# Patient Record
Sex: Male | Born: 1990 | Race: Black or African American | Hispanic: No | Marital: Single | Smoking: Current some day smoker
Health system: Southern US, Community
[De-identification: ages and names within clinical notes are randomized; demographics above are authoritative.]

## PROBLEM LIST (undated history)

## (undated) DIAGNOSIS — F1210 Cannabis abuse, uncomplicated: Secondary | ICD-10-CM

## (undated) DIAGNOSIS — R55 Syncope and collapse: Secondary | ICD-10-CM

## (undated) DIAGNOSIS — Z720 Tobacco use: Secondary | ICD-10-CM

---
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
## 2013-06-25 ENCOUNTER — Emergency Department: Payer: Self-pay | Admitting: Emergency Medicine

## 2013-06-25 LAB — URINALYSIS, COMPLETE
BILIRUBIN, UR: NEGATIVE
Bacteria: NONE SEEN
Blood: NEGATIVE
GLUCOSE, UR: NEGATIVE mg/dL (ref 0–75)
KETONE: NEGATIVE
Leukocyte Esterase: NEGATIVE
NITRITE: NEGATIVE
PROTEIN: NEGATIVE
Ph: 6 (ref 4.5–8.0)
Specific Gravity: 1.003 (ref 1.003–1.030)
Squamous Epithelial: NONE SEEN
WBC UR: 3 /HPF (ref 0–5)

## 2014-01-18 ENCOUNTER — Emergency Department: Payer: Self-pay | Admitting: Emergency Medicine

## 2014-05-13 ENCOUNTER — Emergency Department: Payer: Self-pay | Admitting: Emergency Medicine

## 2014-05-24 ENCOUNTER — Emergency Department: Payer: Self-pay | Admitting: Emergency Medicine

## 2015-06-24 ENCOUNTER — Other Ambulatory Visit: Payer: Self-pay

## 2015-12-21 ENCOUNTER — Emergency Department
Admission: EM | Admit: 2015-12-21 | Discharge: 2015-12-21 | Disposition: A | Discharge status: Home / Self Care | Payer: 59 | Attending: Emergency Medicine | Admitting: Emergency Medicine

## 2015-12-21 ENCOUNTER — Encounter: Payer: Self-pay | Admitting: Emergency Medicine

## 2015-12-21 DIAGNOSIS — Y929 Unspecified place or not applicable: Secondary | ICD-10-CM | POA: Diagnosis not present

## 2015-12-21 DIAGNOSIS — H7291 Unspecified perforation of tympanic membrane, right ear: Secondary | ICD-10-CM | POA: Insufficient documentation

## 2015-12-21 DIAGNOSIS — X58XXXA Exposure to other specified factors, initial encounter: Secondary | ICD-10-CM | POA: Diagnosis not present

## 2015-12-21 DIAGNOSIS — H9201 Otalgia, right ear: Secondary | ICD-10-CM | POA: Diagnosis present

## 2015-12-21 DIAGNOSIS — Y939 Activity, unspecified: Secondary | ICD-10-CM | POA: Diagnosis not present

## 2015-12-21 DIAGNOSIS — Y999 Unspecified external cause status: Secondary | ICD-10-CM | POA: Insufficient documentation

## 2015-12-21 DIAGNOSIS — T161XXA Foreign body in right ear, initial encounter: Secondary | ICD-10-CM | POA: Diagnosis not present

## 2015-12-21 DIAGNOSIS — F17210 Nicotine dependence, cigarettes, uncomplicated: Secondary | ICD-10-CM | POA: Diagnosis not present

## 2015-12-21 MED ORDER — OFLOXACIN 0.3 % OT SOLN: 5.0000 [drp] | Freq: Two times a day (BID) | OTIC | 0 refills | Status: AC

## 2015-12-21 NOTE — ED Provider Notes (Signed)
[facility] Emergency Department Provider Note  ____________________________________________  Time seen: Approximately 5:48 PM  I have reviewed the triage vital signs and the nursing notes.   HISTORY  Chief Complaint Otalgia    HPI Dennis Sanders is a 25 y.o. male who presents emergency department complaining of right ear pain and decreased hearing. Patient reports that he took a shower last night, went to bed with no issues. This morning he woke up with right ear pain and decreased hearing on that side. Patient denies any fevers or chills, nasal congestion, sore throat or other URI symptom. Patient has attempted to "clear my ear" with Q-tips, both tweezers, running water into his ear.   History reviewed. No pertinent past medical history.  There are no active problems to display for this patient.   History reviewed. No pertinent surgical history.  Prior to Admission medications   Medication Sig Start Date End Date Taking? Authorizing Provider  ofloxacin (FLOXIN) 0.3 % otic solution Place 5 drops into the right ear 2 (two) times daily. 12/21/15 12/26/15  Delorise RoyalsJonathan D Shatira Dobosz, PA-C    Allergies Review of patient's allergies indicates no known allergies.  No family history on file.  Social History Social History  Substance Use Topics  . Smoking status: Current Some Day Smoker    Packs/day: 0.20    Types: Cigarettes  . Smokeless tobacco: Never Used  . Alcohol use No     Review of Systems  Constitutional: No fever/chills Eyes: No visual changes. No discharge ENT: As above for right ear pain and decreased hearing on the right side Cardiovascular: no chest pain. Respiratory: no cough. No SOB. Skin: Negative for rash, abrasions, lacerations, ecchymosis. Neurological: Negative for headaches, focal weakness or numbness. 10-point ROS otherwise negative.  ____________________________________________   PHYSICAL EXAM:  VITAL SIGNS: ED Triage  Vitals  Enc Vitals Group     BP 12/21/15 1712 121/77     Pulse Rate 12/21/15 1712 (!) 59     Resp 12/21/15 1712 18     Temp 12/21/15 1712 97.9 F (36.6 C)     Temp Source 12/21/15 1712 Oral     SpO2 12/21/15 1712 99 %     Weight 12/21/15 1706 185 lb (83.9 kg)     Height 12/21/15 1706 5\' 9"  (1.753 m)     Head Circumference --      Peak Flow --      Pain Score 12/21/15 1706 5     Pain Loc --      Pain Edu? --      Excl. in GC? --      Constitutional: Alert and oriented. Well appearing and in no acute distress. Eyes: Conjunctivae are normal. PERRL. EOMI. Head: Atraumatic. ENT:      Ears: EACs and TMs on the left is unremarkable. EAC on right is obstructed with a blue/white marbled substance. Patient still endorses being able to hear and right side but it is decreased when compared with left.      Nose: No congestion/rhinnorhea.      Mouth/Throat: Mucous membranes are moist.  Neck: No stridor.    Cardiovascular: Normal rate, regular rhythm. Normal S1 and S2.  Good peripheral circulation. Respiratory: Normal respiratory effort without tachypnea or retractions. Lungs CTAB. Good air entry to the bases with no decreased or absent breath sounds. Musculoskeletal: Full range of motion to all extremities. No gross deformities appreciated. Neurologic:  Normal speech and language. No gross focal neurologic deficits are appreciated.  Skin:  Skin is warm, dry and intact. No rash noted. Psychiatric: Mood and affect are normal. Speech and behavior are normal. Patient exhibits appropriate insight and judgement.   ____________________________________________   LABS (all labs ordered are listed, but only abnormal results are displayed)  Labs Reviewed - No data to display ____________________________________________  EKG   ____________________________________________  RADIOLOGY   No results found.  ____________________________________________    PROCEDURES  Procedure(s)  performed:    .Foreign Body Removal Date/Time: 12/21/2015 5:51 PM Performed by: Gala RomneyUTHRIELL, Raina Sole D Authorized by: Gala RomneyUTHRIELL, Isaak Delmundo D  Consent: Verbal consent obtained. Consent given by: patient Body area: ear Localization method: ENT speculum and visualized Removal mechanism: ear scoop and irrigation Complexity: simple 1 objects recovered. Objects recovered: soap particle Post-procedure assessment: foreign body removed Comments: [facility]. [facility] side status post removal reveals complete removal foreign body. TM with apparent puncture wound. EAC has signs of abrasion consistent with patient's attempt at removal with Q-tip and blunt tweezers      Medications - No data to display   ____________________________________________   INITIAL IMPRESSION / ASSESSMENT AND PLAN / ED COURSE  Pertinent labs & imaging results that were available during my care of the patient were reviewed by me and considered in my medical decision making (see chart for details).  Clinical Course    Patient's diagnosis is consistent with foreign body in the right ear. This is successfully removed. Visualization status post removal reveals area consistent with puncture to the tympanic membrane.. Patient will be discharged home with prescriptions for antibiotic eardrops. Patient is to follow up with ENT surgeon for follow-up. Patient is given ED precautions to return to the ED for any worsening or new symptoms.     ____________________________________________  FINAL CLINICAL IMPRESSION(S) / ED DIAGNOSES  Final diagnoses:  Foreign body in right ear, initial encounter  Tympanic membrane perforation, right      NEW MEDICATIONS STARTED DURING THIS VISIT:  New Prescriptions   OFLOXACIN (FLOXIN) 0.3 % OTIC SOLUTION    Place 5 drops into the right ear 2 (two) times daily.        This chart was dictated using voice recognition software/Dragon. Despite best efforts to  proofread, errors can occur which can change the meaning. Any change was purely unintentional.    Racheal PatchesJonathan D Raiana Pharris, PA-C 12/21/15 1821    Loleta Roseory Forbach, MD 12/21/15 2015

## 2015-12-21 NOTE — ED Triage Notes (Signed)
Patient presents to the ED with right ear pain.  Patient states, "it feels like there is something in there, I can't get it out, and I can't hear very well."  Patient states pain began last night.

## 2017-06-20 ENCOUNTER — Emergency Department: Payer: 59

## 2017-06-20 ENCOUNTER — Other Ambulatory Visit: Payer: Self-pay

## 2017-06-20 ENCOUNTER — Emergency Department
Admission: EM | Admit: 2017-06-20 | Discharge: 2017-06-20 | Disposition: A | Discharge status: Home / Self Care | Payer: 59 | Attending: Emergency Medicine | Admitting: Emergency Medicine

## 2017-06-20 ENCOUNTER — Encounter: Payer: Self-pay | Admitting: Emergency Medicine

## 2017-06-20 DIAGNOSIS — S60512A Abrasion of left hand, initial encounter: Secondary | ICD-10-CM | POA: Insufficient documentation

## 2017-06-20 DIAGNOSIS — F17210 Nicotine dependence, cigarettes, uncomplicated: Secondary | ICD-10-CM | POA: Diagnosis not present

## 2017-06-20 DIAGNOSIS — Y9389 Activity, other specified: Secondary | ICD-10-CM | POA: Diagnosis not present

## 2017-06-20 DIAGNOSIS — T148XXA Other injury of unspecified body region, initial encounter: Secondary | ICD-10-CM

## 2017-06-20 DIAGNOSIS — S41112A Laceration without foreign body of left upper arm, initial encounter: Secondary | ICD-10-CM

## 2017-06-20 DIAGNOSIS — W25XXXA Contact with sharp glass, initial encounter: Secondary | ICD-10-CM | POA: Diagnosis not present

## 2017-06-20 DIAGNOSIS — Y999 Unspecified external cause status: Secondary | ICD-10-CM | POA: Diagnosis not present

## 2017-06-20 DIAGNOSIS — Y929 Unspecified place or not applicable: Secondary | ICD-10-CM | POA: Insufficient documentation

## 2017-06-20 DIAGNOSIS — S60511A Abrasion of right hand, initial encounter: Secondary | ICD-10-CM | POA: Insufficient documentation

## 2017-06-20 MED ORDER — TETANUS-DIPHTH-ACELL PERTUSSIS 5-2.5-18.5 LF-MCG/0.5 IM SUSP
0.5000 mL | Freq: Once | INTRAMUSCULAR | Status: AC
  Administered 2017-06-20: 0.5 mL via INTRAMUSCULAR

## 2017-06-20 MED ORDER — BACITRACIN ZINC 500 UNIT/GM EX OINT
TOPICAL_OINTMENT | CUTANEOUS | Status: AC
  Filled 2017-06-20: qty 1.8

## 2017-06-20 MED ORDER — LIDOCAINE HCL (PF) 1 % IJ SOLN
INTRAMUSCULAR | Status: AC
  Filled 2017-06-20: qty 10

## 2017-06-20 MED ORDER — OXYCODONE-ACETAMINOPHEN 5-325 MG PO TABS
1.0000 | ORAL_TABLET | Freq: Once | ORAL | Status: AC
  Administered 2017-06-20: 1 via ORAL
  Filled 2017-06-20: qty 1

## 2017-06-20 MED ORDER — TETANUS-DIPHTH-ACELL PERTUSSIS 5-2.5-18.5 LF-MCG/0.5 IM SUSP
INTRAMUSCULAR | Status: AC
  Administered 2017-06-20: 0.5 mL via INTRAMUSCULAR
  Filled 2017-06-20: qty 0.5
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
## 2017-06-20 NOTE — ED Notes (Signed)
XR at bedside
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
## 2017-06-20 NOTE — Discharge Instructions (Signed)
1.  Your tetanus has been updated and will be good for 10 years. 2.  Suture removal in 7-10 days. 3.  Return to the ER for worsening symptoms, increased redness/swelling, purulent discharge or other concerns.

## 2017-06-20 NOTE — ED Notes (Signed)
Pt wounds cleaned with sterile saline

## 2017-06-20 NOTE — ED Notes (Signed)
Pt. Verbalizes understanding of d/c instructions, medications, and follow-up. pain controlled per pt.  Pt. In NAD at time of d/c and denies further concerns regarding this visit. Pt. Stable at the time of departure from the unit, departing unit by the safest and most appropriate manner per that pt condition and limitations with all belongings accounted for. Pt advised to return to the ED at any time for emergent concerns, or for new/worsening symptoms.   

## 2017-06-20 NOTE — ED Notes (Signed)
Bleeding ceased at this time.

## 2017-09-05 ENCOUNTER — Other Ambulatory Visit: Payer: Self-pay

## 2017-09-05 ENCOUNTER — Observation Stay
Admission: EM | Admit: 2017-09-05 | Discharge: 2017-09-06 | Disposition: A | Discharge status: Home / Self Care | Payer: 59 | Attending: Internal Medicine | Admitting: Internal Medicine

## 2017-09-05 DIAGNOSIS — F17210 Nicotine dependence, cigarettes, uncomplicated: Secondary | ICD-10-CM | POA: Insufficient documentation

## 2017-09-05 DIAGNOSIS — Z79899 Other long term (current) drug therapy: Secondary | ICD-10-CM | POA: Diagnosis not present

## 2017-09-05 DIAGNOSIS — S50311A Abrasion of right elbow, initial encounter: Secondary | ICD-10-CM | POA: Insufficient documentation

## 2017-09-05 DIAGNOSIS — R55 Syncope and collapse: Principal | ICD-10-CM | POA: Insufficient documentation

## 2017-09-05 DIAGNOSIS — R001 Bradycardia, unspecified: Secondary | ICD-10-CM | POA: Insufficient documentation

## 2017-09-05 DIAGNOSIS — F1210 Cannabis abuse, uncomplicated: Secondary | ICD-10-CM | POA: Insufficient documentation

## 2017-09-05 HISTORY — DX: Cannabis abuse, uncomplicated: F12.10

## 2017-09-05 HISTORY — DX: Tobacco use: Z72.0

## 2017-09-05 HISTORY — DX: Syncope and collapse: R55

## 2017-09-05 LAB — BASIC METABOLIC PANEL
Anion gap: 8 (ref 5–15)
BUN: 8 mg/dL (ref 6–20)
CO2: 25 mmol/L (ref 22–32)
CREATININE: 0.85 mg/dL (ref 0.61–1.24)
Calcium: 8.8 mg/dL — ABNORMAL LOW (ref 8.9–10.3)
Chloride: 107 mmol/L (ref 101–111)
GFR calc Af Amer: >60 mL/min (ref >60)
GFR calc non Af Amer: >60 mL/min (ref >60)
GLUCOSE: 103 mg/dL — AB (ref 65–99)
Potassium: 3.8 mmol/L (ref 3.5–5.1)
SODIUM: 140 mmol/L (ref 135–145)

## 2017-09-05 LAB — HEPATIC FUNCTION PANEL
ALK PHOS: 80 U/L (ref 38–126)
ALT: 21 U/L (ref 17–63)
AST: 30 U/L (ref 15–41)
Albumin: 3.9 g/dL (ref 3.5–5.0)
TOTAL PROTEIN: 6.8 g/dL (ref 6.5–8.1)
Total Bilirubin: 0.7 mg/dL (ref 0.3–1.2)

## 2017-09-05 LAB — TROPONIN I

## 2017-09-05 LAB — CBC
HCT: 41.7 % (ref 40.0–52.0)
Hemoglobin: 14.3 g/dL (ref 13.0–18.0)
MCH: 28.5 pg (ref 26.0–34.0)
MCHC: 34.2 g/dL (ref 32.0–36.0)
MCV: 83.3 fL (ref 80.0–100.0)
PLATELETS: 226 10*3/uL (ref 150–440)
RBC: 5 MIL/uL (ref 4.40–5.90)
RDW: 13.6 % (ref 11.5–14.5)
WBC: 6.4 10*3/uL (ref 3.8–10.6)
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
## 2017-09-06 ENCOUNTER — Observation Stay (HOSPITAL_BASED_OUTPATIENT_CLINIC_OR_DEPARTMENT_OTHER)
Admit: 2017-09-06 | Discharge: 2017-09-06 | Disposition: A | Discharge status: Home / Self Care | Payer: 59 | Attending: Internal Medicine | Admitting: Internal Medicine

## 2017-09-06 ENCOUNTER — Emergency Department: Payer: 59

## 2017-09-06 ENCOUNTER — Observation Stay: Payer: 59

## 2017-09-06 ENCOUNTER — Other Ambulatory Visit: Payer: Self-pay | Admitting: Nurse Practitioner

## 2017-09-06 DIAGNOSIS — R55 Syncope and collapse: Secondary | ICD-10-CM

## 2017-09-06 DIAGNOSIS — R001 Bradycardia, unspecified: Secondary | ICD-10-CM

## 2017-09-06 LAB — HEMOGLOBIN A1C
Hgb A1c MFr Bld: 5.3 % (ref 4.8–5.6)
Mean Plasma Glucose: 105.41 mg/dL

## 2017-09-06 LAB — URINE DRUG SCREEN, QUALITATIVE (ARMC ONLY)
AMPHETAMINES, UR SCREEN: NOT DETECTED
Barbiturates, Ur Screen: NOT DETECTED
Benzodiazepine, Ur Scrn: NOT DETECTED
CANNABINOID 50 NG, UR ~~LOC~~: POSITIVE — AB
COCAINE METABOLITE, UR ~~LOC~~: NOT DETECTED
MDMA (ECSTASY) UR SCREEN: NOT DETECTED
Methadone Scn, Ur: NOT DETECTED
Opiate, Ur Screen: POSITIVE — AB
Phencyclidine (PCP) Ur S: NOT DETECTED
Tricyclic, Ur Screen: NOT DETECTED

## 2017-09-06 LAB — ECHOCARDIOGRAM COMPLETE
Height: 69 in
Weight: 3224 oz

## 2017-09-06 LAB — ETHANOL: Alcohol, Ethyl (B): <10 mg/dL (ref <10)

## 2017-09-06 LAB — TSH: TSH: 0.841 u[IU]/mL (ref 0.350–4.500)

## 2017-09-06 MED ORDER — ONDANSETRON HCL 4 MG PO TABS: 4.0000 mg | ORAL_TABLET | Freq: Four times a day (QID) | ORAL | Status: DC | PRN

## 2017-09-06 MED ORDER — DOCUSATE SODIUM 100 MG PO CAPS
100.0000 mg | ORAL_CAPSULE | Freq: Two times a day (BID) | ORAL | Status: DC
  Administered 2017-09-06: 100 mg via ORAL
  Filled 2017-09-06: qty 1

## 2017-09-06 MED ORDER — IBUPROFEN 600 MG PO TABS
600.0000 mg | ORAL_TABLET | Freq: Once | ORAL | Status: AC
  Administered 2017-09-06: 600 mg via ORAL
  Filled 2017-09-06: qty 1

## 2017-09-06 MED ORDER — IOPAMIDOL (ISOVUE-370) INJECTION 76%
100.0000 mL | Freq: Once | INTRAVENOUS | Status: AC | PRN
  Administered 2017-09-06: 100 mL via INTRAVENOUS

## 2017-09-06 MED ORDER — ONDANSETRON HCL 4 MG/2ML IJ SOLN: 4.0000 mg | Freq: Four times a day (QID) | INTRAMUSCULAR | Status: DC | PRN

## 2017-09-06 MED ORDER — ACETAMINOPHEN 325 MG PO TABS: 650.0000 mg | ORAL_TABLET | Freq: Four times a day (QID) | ORAL | Status: DC | PRN

## 2017-09-06 MED ORDER — ACETAMINOPHEN 650 MG RE SUPP: 650.0000 mg | Freq: Four times a day (QID) | RECTAL | Status: DC | PRN

## 2017-09-06 MED ORDER — SODIUM CHLORIDE 0.9 % IV SOLN
INTRAVENOUS | Status: DC
  Administered 2017-09-06: 04:00:00 via INTRAVENOUS

## 2017-09-06 MED ORDER — HYDROCODONE-ACETAMINOPHEN 5-325 MG PO TABS
1.0000 | ORAL_TABLET | Freq: Once | ORAL | Status: AC
  Administered 2017-09-06: 1 via ORAL
  Filled 2017-09-06: qty 1

## 2017-09-06 MED ORDER — IOPAMIDOL (ISOVUE-300) INJECTION 61%
30.0000 mL | Freq: Once | INTRAVENOUS | Status: AC | PRN
  Administered 2017-09-06: 30 mL via ORAL
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
## 2017-09-06 NOTE — Progress Notes (Signed)
Went over discharge instructions with the patient and family including medication and follow-up appointment. Discontinue peripheral IV and telemetry monitor. NT helped patient to be discharge.

## 2017-09-06 NOTE — Discharge Instructions (Signed)
Follow-up with primary care physician in 1 week Follow-up with cardiology in 2 to 3 days and patient has to get outpatient loop monitor Strictly instructed not to drive at least for 6 months from the syncopal episode Outpatient follow-up with neurology in 1 to 2 weeks

## 2017-09-06 NOTE — Progress Notes (Signed)
*  PRELIMINARY RESULTS* Echocardiogram 2D Echocardiogram has been performed.  Dennis Sanders 09/06/2017, 12:41 PM
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
## 2017-09-10 ENCOUNTER — Telehealth: Payer: Self-pay | Admitting: *Deleted

## 2017-09-10 NOTE — Telephone Encounter (Signed)
-----   Message from Coralee Rud sent at 09/09/2017  2:41 PM EDT ----- Regarding: needs event monitor Per Nicolasa Ducking, NP for syncope

## 2017-09-10 NOTE — Telephone Encounter (Signed)
Patient returning call.

## 2017-09-10 NOTE — Telephone Encounter (Signed)
No answer. Left message to call back.   Patient registered on the [employer] website to receive in the mail.

## 2017-09-11 NOTE — Telephone Encounter (Signed)
I left a message for the patient to call. 

## 2017-09-11 NOTE — Telephone Encounter (Signed)
I spoke with the patient- he is aware that his monitor has been ordered from [employer] and will be shipped directly to his home address. I have advised him if he does not receive the monitor by this time next week to please call and let us know. The patient is agreeable and voices understanding.   The patient also states he has been researching his symptoms on the internet and wanted to talk further with the doctor or PA about what happened to him. I advised him I will forward a message to Ward Givens, NP since he saw him in the hospital to see if he would be willing to call and discuss further with the patient.Marland Kitchen He is not scheduled to come in for follow up until 10/21/17. His cell number is his best contact #.

## 2017-09-12 ENCOUNTER — Ambulatory Visit: Payer: 59 | Admitting: Internal Medicine

## 2017-09-13 NOTE — Telephone Encounter (Signed)
Left message to call back  

## 2017-09-20 ENCOUNTER — Telehealth: Payer: Self-pay | Admitting: Nurse Practitioner

## 2017-09-20 ENCOUNTER — Ambulatory Visit (INDEPENDENT_AMBULATORY_CARE_PROVIDER_SITE_OTHER): Payer: 59

## 2017-09-20 DIAGNOSIS — R55 Syncope and collapse: Secondary | ICD-10-CM

## 2017-09-20 DIAGNOSIS — R001 Bradycardia, unspecified: Secondary | ICD-10-CM

## 2017-09-20 NOTE — Telephone Encounter (Signed)
Spoke with Crystal from [employer] and she states that the patient hooked up and they received a "passed out" indication. She reports that he was in sinus rhythm with a rate of 92 and they did try to reach the patient to confirm. Attempted to call as well and had to leave voicemail message for him to call back. Crystal requested that once we get in touch with the patient to please give her a call back with update at [phone].
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
## 2017-10-21 ENCOUNTER — Ambulatory Visit: Payer: 59 | Admitting: Nurse Practitioner

## 2017-10-22 ENCOUNTER — Ambulatory Visit: Payer: 59 | Admitting: Nurse Practitioner

## 2017-10-22 DIAGNOSIS — R0989 Other specified symptoms and signs involving the circulatory and respiratory systems: Secondary | ICD-10-CM

## 2017-10-23 ENCOUNTER — Encounter: Payer: Self-pay | Admitting: Nurse Practitioner

## 2017-10-29 ENCOUNTER — Encounter: Payer: Self-pay | Admitting: *Deleted

## 2017-11-26 ENCOUNTER — Ambulatory Visit (INDEPENDENT_AMBULATORY_CARE_PROVIDER_SITE_OTHER): Payer: 59 | Admitting: Nurse Practitioner

## 2017-11-26 ENCOUNTER — Encounter: Payer: Self-pay | Admitting: Nurse Practitioner

## 2017-11-26 VITALS — BP 108/78 | HR 61 | Ht 69.0 in | Wt 195.8 lb

## 2017-11-26 DIAGNOSIS — R55 Syncope and collapse: Secondary | ICD-10-CM | POA: Diagnosis not present
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
## 2017-11-26 NOTE — Patient Instructions (Signed)
Medication Instructions:  Your physician recommends that you continue on your current medications as directed. Please refer to the Current Medication list given to you today.   Labwork: none  Testing/Procedures: none  Follow-Up: Your physician recommends that you schedule a follow-up appointment in: as needed.    

## 2018-12-25 ENCOUNTER — Ambulatory Visit: Payer: Self-pay | Admitting: Nurse Practitioner

## 2018-12-25 ENCOUNTER — Other Ambulatory Visit: Payer: Self-pay

## 2018-12-25 DIAGNOSIS — Z113 Encounter for screening for infections with a predominantly sexual mode of transmission: Secondary | ICD-10-CM

## 2018-12-25 NOTE — Progress Notes (Signed)
Here today for STD screening. Accepts bloodwork. Hal Morales, RN Gram Stain results reviewed. No treatment indicated per standing orders. Hal Morales, RN

## 2018-12-25 NOTE — Progress Notes (Signed)
STI clinic/screening visit  Subjective:  Dennis Sanders is a 28 y.o. male being seen today for an STI screening visit. The patient reports they do not have symptoms.  Patient has the following medical conditions:   Patient Active Problem List   Diagnosis Date Noted  . Syncope 09/06/2017     Chief Complaint  Patient presents with  . SEXUALLY TRANSMITTED DISEASE    Just getting tested Denies symptoms at this time   Patient reports - desire for STD testing  Denies any symptoms at this time  See flowsheet for further details and programmatic requirements.    The following portions of the patient's history were reviewed and updated as appropriate: allergies, current medications, past medical history, past social history, past surgical history and problem list.  Objective:  There were no vitals filed for this visit.  Physical Exam Constitutional:      Appearance: Normal appearance.  HENT:     Head: Normocephalic and atraumatic.     Comments: No nits or hair loss    Mouth/Throat:     Mouth: Mucous membranes are moist.     Pharynx: Oropharynx is clear. No oropharyngeal exudate or posterior oropharyngeal erythema.   Pulmonary:     Effort: Pulmonary effort is normal.  Abdominal:     General: Abdomen is flat.     Palpations: Abdomen is soft. There is no hepatomegaly or mass.     Tenderness: There is no abdominal tenderness.  Genitourinary:    Pubic Area: No rash or pubic lice.      Penis: Circumcised. Discharge (small amt clear discharge noted) present.      Scrotum/Testes: Normal.     Epididymis:     Right: Normal.     Left: Normal.  Lymphadenopathy:     Head:     Right side of head: No preauricular or posterior auricular adenopathy.     Left side of head: No preauricular or posterior auricular adenopathy.     Cervical: No cervical adenopathy.     Upper Body:     Right upper body: No supraclavicular or axillary adenopathy.     Left upper body: No supraclavicular or  axillary adenopathy.     Lower Body: No right inguinal adenopathy. No left inguinal adenopathy.  Skin:    General: Skin is warm and dry.     Findings: No rash.  Neurological:     Mental Status: He is alert and oriented to person, place, and time.       Assessment and Plan:  Dennis Sanders is a 28 y.o. male presenting to the [facility] for STI screening  1. Screening examination for STD (sexually transmitted disease) Await results - Gram stain - please treat per standing order - Gonococcus culture - HIV [facility] - Syphilis Serology, [facility]  Client verbalizes understanding and is in agreement with plan of care    No follow-ups on file.  No future appointments.  Berniece Andreas, NP

## 2018-12-26 ENCOUNTER — Encounter: Payer: Self-pay | Admitting: Nurse Practitioner

## 2018-12-26 DIAGNOSIS — Z720 Tobacco use: Secondary | ICD-10-CM | POA: Insufficient documentation

## 2018-12-26 LAB — GRAM STAIN

## 2018-12-29 LAB — GONOCOCCUS CULTURE

## 2019-01-09 ENCOUNTER — Telehealth: Payer: Self-pay

## 2019-01-09 NOTE — Telephone Encounter (Signed)
TC from patient.  Discussed RPR false positive result. Patient would like to come in for recheck on 01/13/19 (works 3rd shift and needs to come in first thing in the morning). Appt scheduled.Aileen Fass, RN

## 2019-01-13 ENCOUNTER — Other Ambulatory Visit: Payer: Self-pay
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
## 2019-06-22 IMAGING — CT CT HEAD W/O CM
3 series · 15 of 47 positions shown, 18 images · non-contrast
Comparison: None.

CLINICAL DATA: Ataxia

EXAM:
CT HEAD WITHOUT CONTRAST
TECHNIQUE: Contiguous axial images were obtained from the base of the skull
through the vertex without intravenous contrast.

[Series 2: head wo · axial · 0.45mm/px · z∈[-71,+59]mm · 9 of 32 slices shown, 12 images]
[im 3/32  brain]
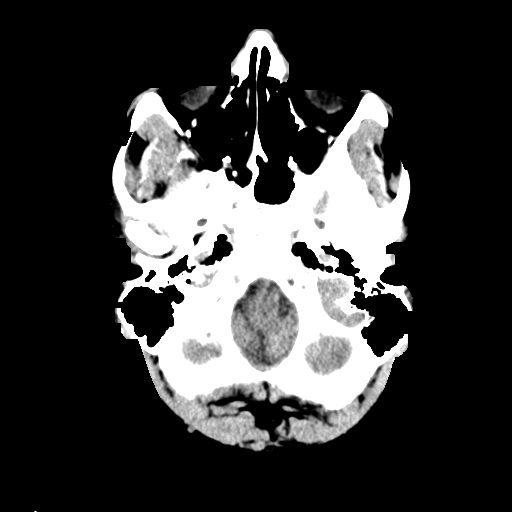
[im 3/32  bone]
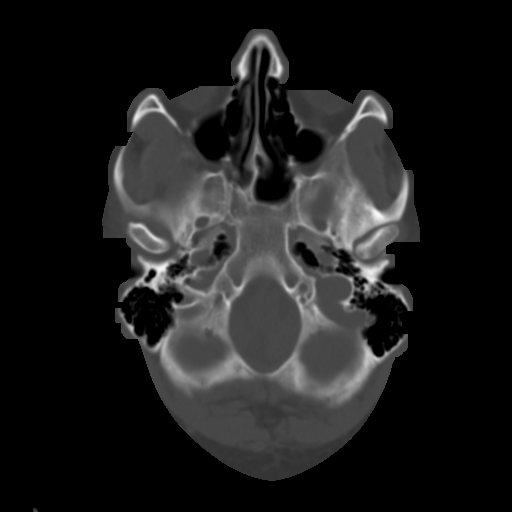
[im 6/32  brain]
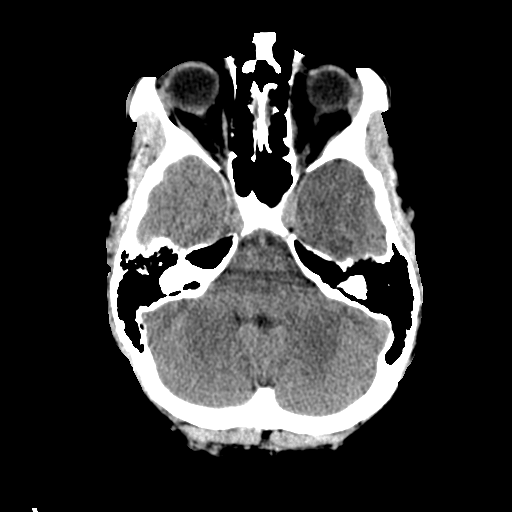
[im 9/32  brain]
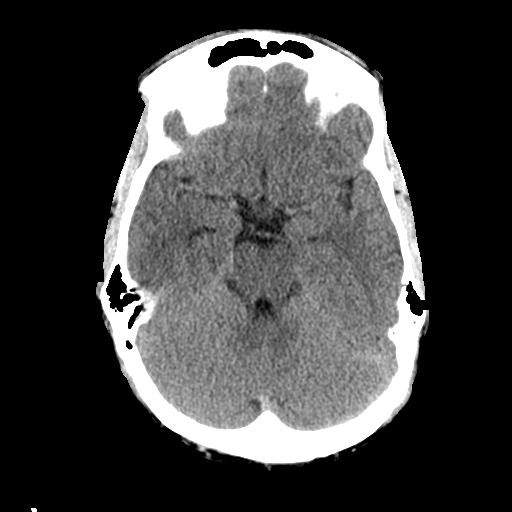
[im 12/32  brain]
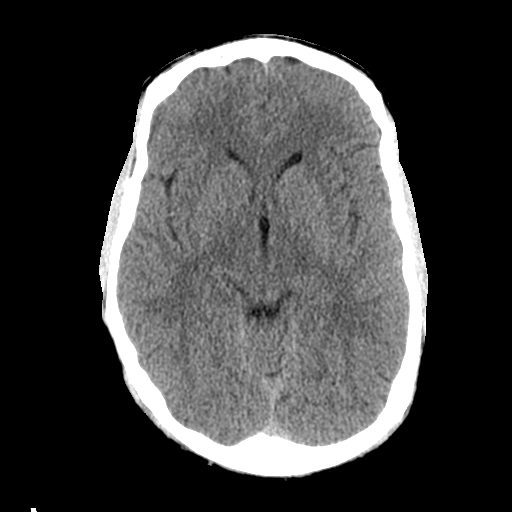
[im 17/32  brain]
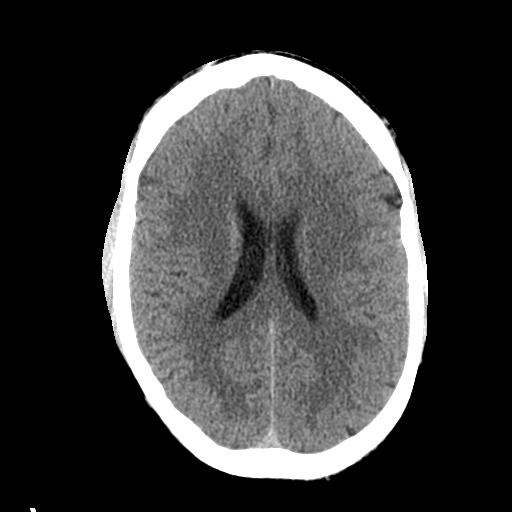
[im 17/32  bone]
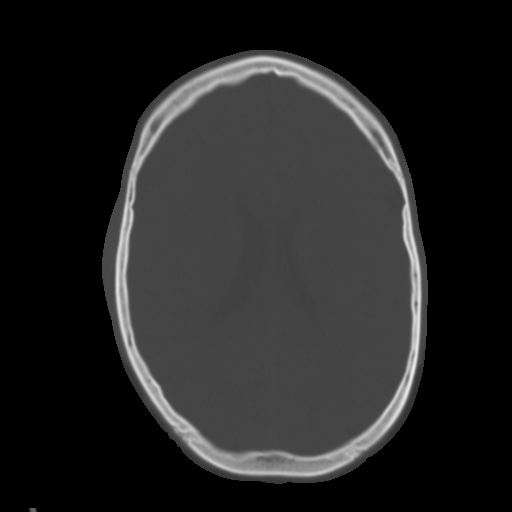
[im 20/32  brain]
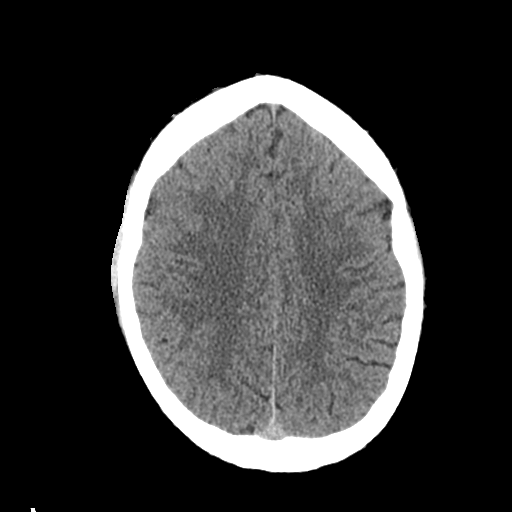
[im 23/32  brain]
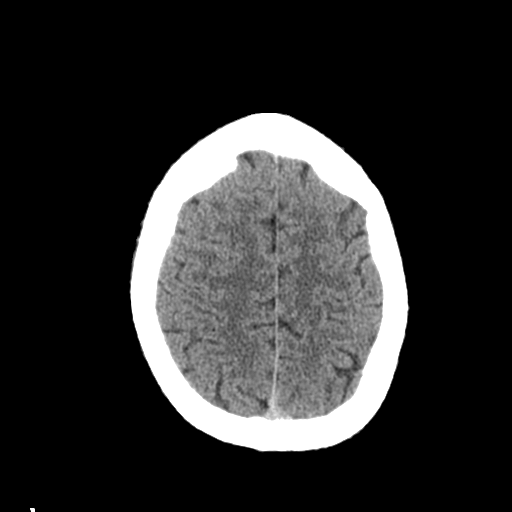
[im 26/32  brain]
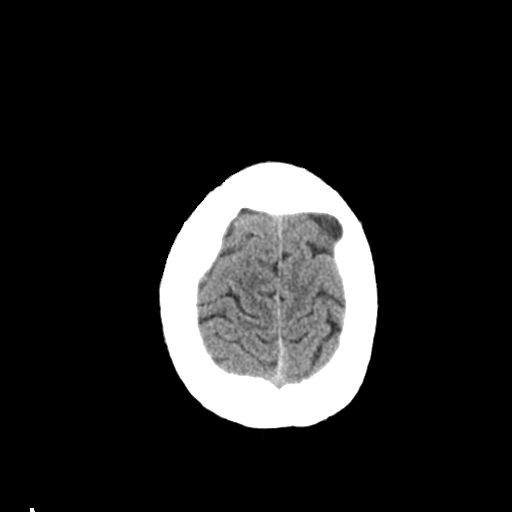
[im 29/32  brain]
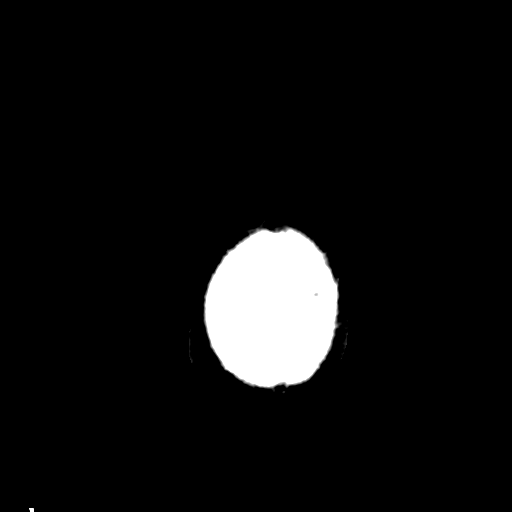
[im 29/32  bone]
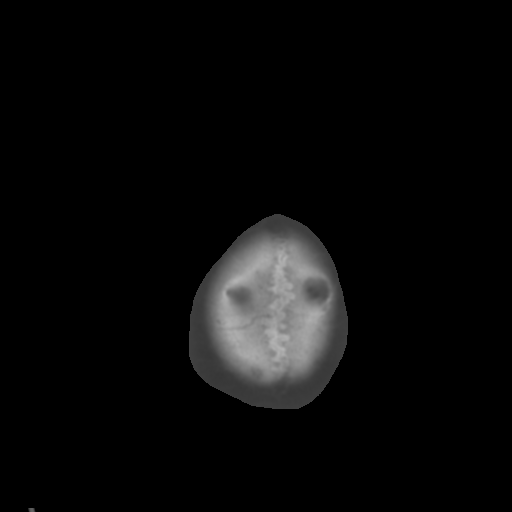

[Series 4: coronal soft tissue · coronal · 0.31mm/px · 3 of 69 slices shown]
[im 23/69  brain]
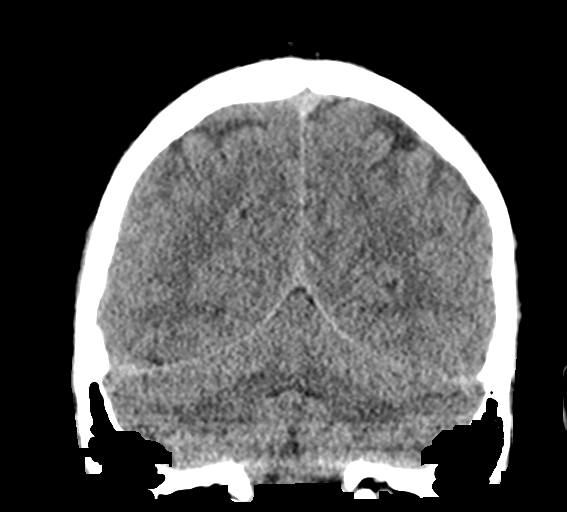
[im 31/69  brain]
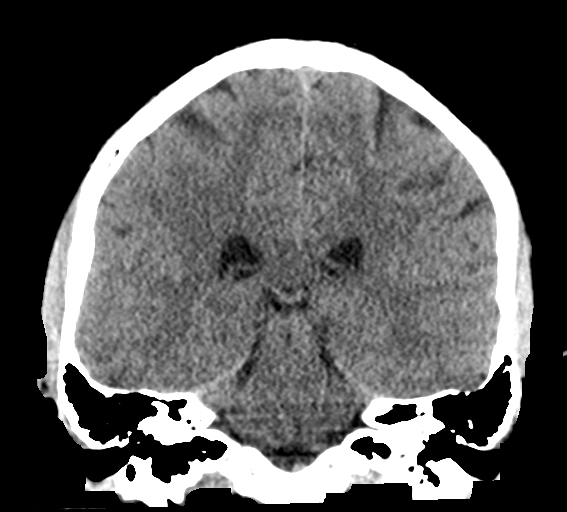
[im 38/69  brain]
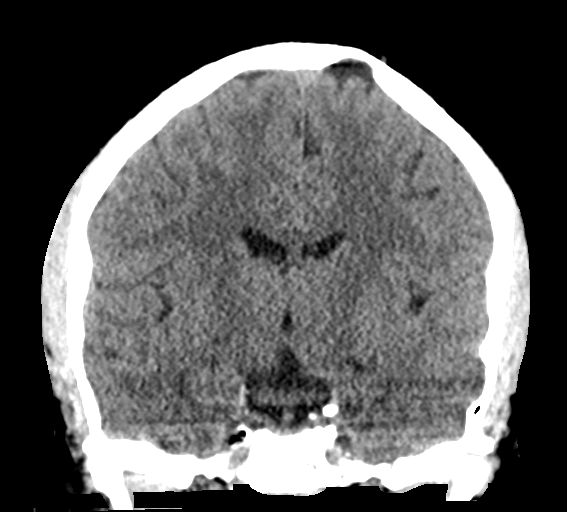

[Series 5: sagittal soft tissue · sagittal · 0.31mm/px · 3 of 55 slices shown]
[im 19/55  brain]
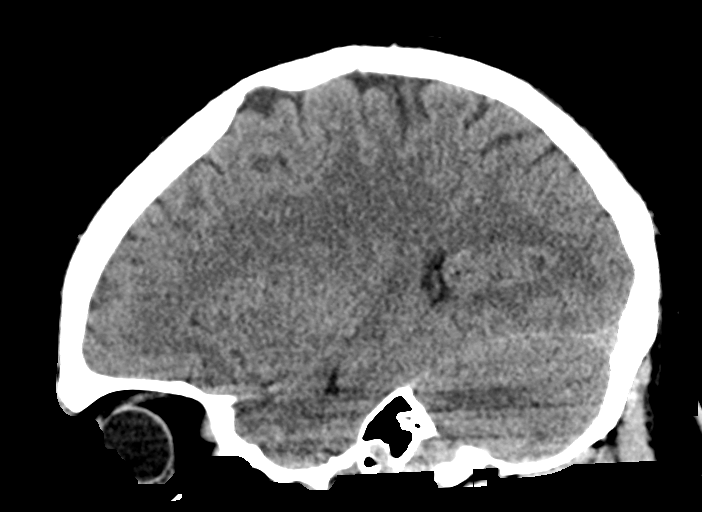
[im 28/55  brain]
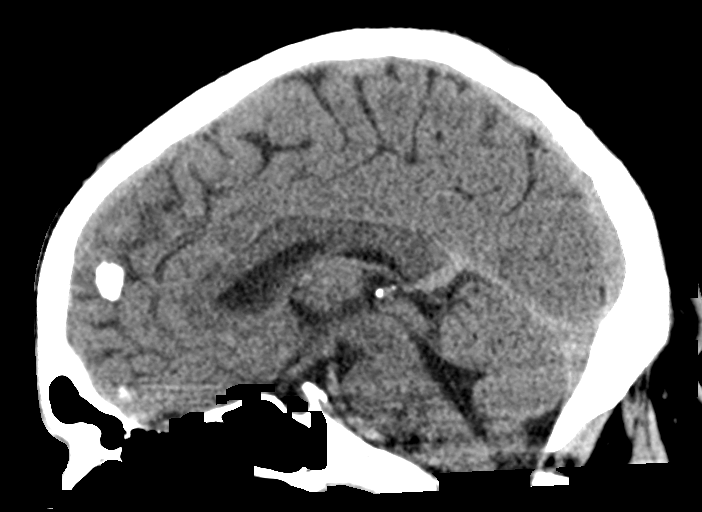
[im 37/55  brain]
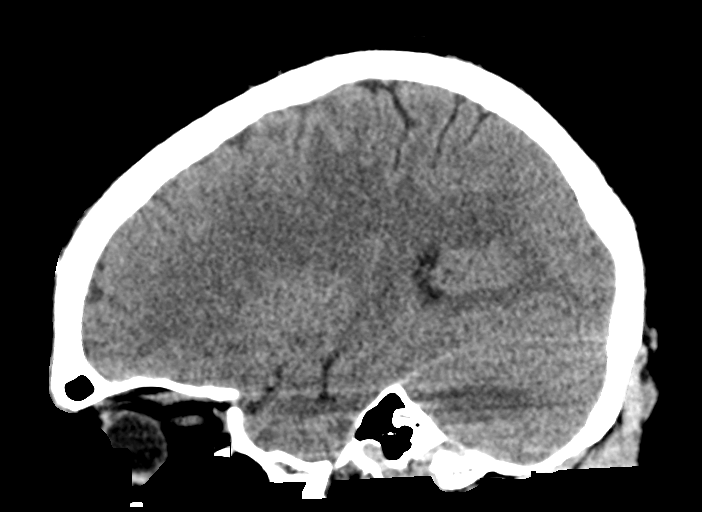

[15 of 47 positions shown; findings below may reference images not displayed]

FINDINGS: Brain: No evidence of acute infarction, hemorrhage, hydrocephalus,
extra-axial collection or mass lesion/mass effect.

Vascular: No hyperdense vessel or unexpected calcification.

Skull: Normal. Negative for fracture or focal lesion.

Sinuses/Orbits: No acute finding.

Other: None
IMPRESSION: Negative non contrasted CT appearance of the brain

## 2019-10-24 ENCOUNTER — Encounter (HOSPITAL_COMMUNITY): Payer: Self-pay

## 2019-10-24 ENCOUNTER — Other Ambulatory Visit: Payer: Self-pay

## 2019-10-24 ENCOUNTER — Ambulatory Visit (HOSPITAL_COMMUNITY)
Admission: EM | Admit: 2019-10-24 | Discharge: 2019-10-24 | Disposition: A | Discharge status: Home / Self Care | Payer: 59 | Attending: Family Medicine | Admitting: Family Medicine

## 2019-10-24 DIAGNOSIS — M940 Chondrocostal junction syndrome [Tietze]: Secondary | ICD-10-CM

## 2019-10-24 MED ORDER — DICLOFENAC SODIUM 75 MG PO TBEC: 75.0000 mg | DELAYED_RELEASE_TABLET | Freq: Two times a day (BID) | ORAL | 0 refills | Status: DC

## 2019-10-24 NOTE — ED Triage Notes (Signed)
Pt describes pain to area below collar bone and to central chest that occurs when he turns; lifts arms, pushes/pulls for past several days. 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 Denies pain at rest or pain to jaw, arm/back.  Pt states this morning after he moved/turned and raised himself out of bed pain occurred and "it made me sweat and breathe hard"; pain subsided when pt rested and then controlled his muscular movements.   Denies SOB, nausea, dizziness.   Took tylenol 325mg  at 1130 this morning that improved symptoms.  Pt requests work note.

## 2019-10-24 NOTE — ED Provider Notes (Signed)
[facility]   [id number] 10/24/19 Arrival Time: 1457  ASSESSMENT & PLAN:  1. Costochondritis     Work note provided. Recommend no heavy lifting for one week. Begin: Meds ordered this encounter  Medications  . diclofenac (VOLTAREN) 75 MG EC tablet    Sig: Take 1 tablet (75 mg total) by mouth 2 (two) times daily.    Dispense:  14 tablet    Refill:  0    Chest pain precautions given. Reviewed expectations re: course of current medical issues. Questions answered. Outlined signs and symptoms indicating need for more acute intervention. Patient verbalized understanding. After Visit Summary given.   SUBJECTIVE:  History from: patient. Dennis Sanders is a 29 y.o. male who presents with complaint of chest wall soreness. Has been working new job where he is lifting heavy items all day; questions relation. No trauma. No associated SOB. Pain much worse with certain movements. Tylenol with some relief. No n/v.   Social History   Tobacco Use  Smoking Status Current Some Day Smoker  . Packs/day: 0.20  . Types: Cigarettes  Smokeless Tobacco Never Used   Social History   Substance and Sexual Activity  Alcohol Use Yes   Comment: occasional - special occasions only      OBJECTIVE:  Vitals:   10/24/19 1532 10/24/19 1533  BP:  (!) 118/59  Pulse: (!) 55   Resp: 18   Temp: 98.5 F (36.9 C)   TempSrc: Oral   SpO2: 99%     General appearance: alert, oriented, no acute distress Eyes: PERRLA; EOMI; conjunctivae normal HENT: normocephalic; atraumatic Neck: supple with FROM Lungs: without labored respirations; speaks full sentences without difficulty Heart: regular Chest Wall: with significant tenderness to palpation around sternum Abdomen: soft, non-tender; no guarding or rebound tenderness Extremities: without edema; without calf swelling or tenderness; symmetrical without gross deformities Skin: warm and dry; without rash or lesions Neuro: normal  gait Psychological: alert and cooperative; normal mood and affect   No Known Allergies  Past Medical History:  Diagnosis Date  . Marijuana abuse    a. smokes a few x/wk.  . Syncope    a. 09/2017 Echo: EF 55-60%, no rwma; b. 10/2017 Event monitor: NSR, no significant arrhythmias. Rare period of bradycardia w/ rates into low 40's.  . Tobacco abuse    Social History   Socioeconomic History  . Marital status: Single    Spouse name: Not on file  . Number of children: Not on file  . Years of education: Not on file  . Highest education level: Not on file  Occupational History  . Not on file  Tobacco Use  . Smoking status: Current Some Day Smoker    Packs/day: 0.20    Types: Cigarettes  . Smokeless tobacco: Never Used  Vaping Use  . Vaping Use: Never used  Substance and Sexual Activity  . Alcohol use: Yes    Comment: occasional - special occasions only  . Drug use: Yes    Types: Marijuana    Comment: few x/ wk.  . Sexual activity: Not on file  Other Topics Concern  . Not on file  Social History Narrative   Lives in [place] with his grandparents.  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).   Social Determinants of Health   Financial Resource Strain:   . Difficulty of Paying Living Expenses:   Food Insecurity:   . Worried About Programme researcher, broadcasting/film/video in the Last Year:   .  Ran Out of Food in the Last Year:   Transportation Needs:   . Film/video editor (Medical):   Marland Kitchen Lack of Transportation (Non-Medical):   Physical Activity:   . Days of Exercise per Week:   . Minutes of Exercise per Session:   Stress:   . Feeling of Stress :   Social Connections:   . Frequency of Communication with Friends and Family:   . Frequency of Social Gatherings with Friends and Family:   . Attends Religious Services:   . Active Member of Clubs or Organizations:   . Attends Archivist Meetings:   Marland Kitchen Marital Status:   Intimate Partner Violence:   . Fear of Current  or Ex-Partner:   . Emotionally Abused:   Marland Kitchen Physically Abused:   . Sexually Abused:    Family History  Problem Relation Age of Onset  . Other Mother        alive and well @ 44  . Other Father        alive and well @ 40  . Arrhythmia Neg Hx    History reviewed. No pertinent surgical history.   Vanessa Kick, MD 10/24/19 1627

## 2019-10-24 NOTE — Discharge Instructions (Signed)
I recommend no heavy lifting for one week.

## 2020-01-12 ENCOUNTER — Encounter: Payer: Self-pay | Admitting: Emergency Medicine

## 2020-01-12 ENCOUNTER — Emergency Department
Admission: EM | Admit: 2020-01-12 | Discharge: 2020-01-12 | Disposition: A | Discharge status: Home / Self Care | Payer: Self-pay | Attending: Emergency Medicine | Admitting: Emergency Medicine

## 2020-01-12 ENCOUNTER — Other Ambulatory Visit: Payer: Self-pay

## 2020-01-12 ENCOUNTER — Emergency Department: Payer: Self-pay

## 2020-01-12 DIAGNOSIS — F17210 Nicotine dependence, cigarettes, uncomplicated: Secondary | ICD-10-CM | POA: Insufficient documentation

## 2020-01-12 DIAGNOSIS — Y939 Activity, unspecified: Secondary | ICD-10-CM | POA: Insufficient documentation

## 2020-01-12 DIAGNOSIS — Z79899 Other long term (current) drug therapy: Secondary | ICD-10-CM | POA: Insufficient documentation

## 2020-01-12 DIAGNOSIS — S61412A Laceration without foreign body of left hand, initial encounter: Secondary | ICD-10-CM | POA: Insufficient documentation

## 2020-01-12 DIAGNOSIS — Y929 Unspecified place or not applicable: Secondary | ICD-10-CM | POA: Insufficient documentation

## 2020-01-12 DIAGNOSIS — S060X0A Concussion without loss of consciousness, initial encounter: Secondary | ICD-10-CM | POA: Insufficient documentation

## 2020-01-12 DIAGNOSIS — W274XXA Contact with kitchen utensil, initial encounter: Secondary | ICD-10-CM | POA: Insufficient documentation

## 2020-01-12 DIAGNOSIS — Y999 Unspecified external cause status: Secondary | ICD-10-CM | POA: Insufficient documentation

## 2020-01-12 MED ORDER — IBUPROFEN 600 MG PO TABS: 600.0000 mg | ORAL_TABLET | Freq: Four times a day (QID) | ORAL | 0 refills | Status: DC | PRN

## 2020-01-12 MED ORDER — LIDOCAINE HCL (PF) 1 % IJ SOLN
5.0000 mL | Freq: Once | INTRAMUSCULAR | Status: AC
  Administered 2020-01-12: 5 mL via INTRADERMAL
  Filled 2020-01-12: qty 5
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
## 2020-03-25 ENCOUNTER — Ambulatory Visit: Payer: Self-pay | Admitting: Family Medicine

## 2020-03-25 ENCOUNTER — Other Ambulatory Visit: Payer: Self-pay

## 2020-03-25 ENCOUNTER — Encounter: Payer: Self-pay | Admitting: Family Medicine

## 2020-03-25 VITALS — Wt 201.6 lb

## 2020-03-25 DIAGNOSIS — Z113 Encounter for screening for infections with a predominantly sexual mode of transmission: Secondary | ICD-10-CM

## 2020-03-25 DIAGNOSIS — A549 Gonococcal infection, unspecified: Secondary | ICD-10-CM

## 2020-03-25 MED ORDER — DOXYCYCLINE HYCLATE 100 MG PO TABS: 100.0000 mg | ORAL_TABLET | Freq: Two times a day (BID) | ORAL | 0 refills | Status: DC

## 2020-03-25 MED ORDER — CEFTRIAXONE SODIUM 500 MG IJ SOLR
500.0000 mg | Freq: Once | INTRAMUSCULAR | Status: AC
  Administered 2020-03-25: 500 mg via INTRAMUSCULAR

## 2020-03-25 MED ORDER — DOXYCYCLINE HYCLATE 100 MG PO TABS: 100.0000 mg | ORAL_TABLET | Freq: Two times a day (BID) | ORAL | 0 refills | Status: AC

## 2020-03-25 NOTE — Progress Notes (Signed)
   [facility] Department STI clinic/screening visit  Subjective:  Dennis Sanders is a 29 y.o. male being seen today for an STI screening visit. The patient reports they do have symptoms.    Patient has the following medical conditions:   Patient Active Problem List   Diagnosis Date Noted  . Tobacco use 12/26/2018  . Syncope 09/06/2017     Chief Complaint  Patient presents with  . SEXUALLY TRANSMITTED DISEASE    HPI  Patient reports having discharge    See flowsheet for further details and programmatic requirements.    The following portions of the patient's history were reviewed and updated as appropriate: allergies, current medications, past medical history, past social history, past surgical history and problem list.  Objective:  There were no vitals filed for this visit.  Physical Exam Constitutional:      Appearance: Normal appearance.  HENT:     Head: Normocephalic.     Comments: In scalp, brows and lashes: no nits, no hair loss     Mouth/Throat:     Mouth: Mucous membranes are moist.     Pharynx: Oropharynx is clear. No oropharyngeal exudate.     Comments: Dental carries and teeth missing  Genitourinary:    Comments: .No lice, nits, or pest, no lesions or odor.  Discharge with yellowish green discharge.  Denies pain or tenderness with paplation of testicles.  No lesions, ulcers or masses present.   Musculoskeletal:     Cervical back: Normal range of motion.  Lymphadenopathy:     Cervical: No cervical adenopathy.  Skin:    General: Skin is warm and dry.     Findings: No bruising, erythema, lesion or rash.  Neurological:     Mental Status: He is alert and oriented to person, place, and time.  Psychiatric:        Mood and Affect: Mood normal.        Behavior: Behavior normal.       Assessment and Plan:  WILBORN MEMBRENO is a 29 y.o. male presenting to the [facility] Department for STI screening  1. Screening examination for  venereal disease    - HIV/HCV [place] [facility] - HBV Antigen/Antibody [facility] - Gram stain - Gonococcus culture - Syphilis Serology, [place]  Patient does have STI symptoms Patient accepted all screenings including  Gram stain, urethral GC  and bloodwork for  HCV, HBV & HIV/RPR.  Patient meets criteria for HepB screening? Yes. Ordered? Yes Patient meets criteria for HepC screening? Yes. Ordered? Yes Recommended condom use with all sex Discussed importance of condom use for STI prevent  2. Gonorrhea   Per gram stain, + gonorrhea, treat for coinfection of chlamydia with  Doxycycline 100 mg BID x 7 days and Ceftraxione 500 mg IM x 1.     Discussed time line for State Lab results and that patient will be called with positive results and encouraged patient to call if he had not heard in 2 weeks   Return if symptoms worsen or fail to improve, for as needed.  No future appointments.  Wendi Snipes, FNP

## 2020-03-25 NOTE — Progress Notes (Addendum)
Per client, passed out when last had penis culture. Jossie Ng, RN Client tolerated Ceftriaxone injection without complaint and counseled to remain in main waiting room for 15 minutes following injection. Client verbalized understanding of request. Jossie Ng, RN

## 2020-03-25 NOTE — Addendum Note (Signed)
Addended by: Jossie Ng on: 03/25/2020 04:43 PM   Modules accepted: Orders

## 2020-03-26 LAB — GRAM STAIN

## 2020-04-01 LAB — GONOCOCCUS CULTURE

## 2020-04-04 ENCOUNTER — Telehealth: Payer: Self-pay

## 2020-04-04 DIAGNOSIS — A549 Gonococcal infection, unspecified: Secondary | ICD-10-CM

## 2020-04-04 NOTE — Telephone Encounter (Signed)
Attempted TC to patient re: +GCresults.  Call went to VM but message didn't indicate if it was correct #. No message left  Patient tx'd at visit.  CD report completed Richmond Campbell, RN

## 2020-04-06 LAB — HEPATITIS B SURFACE ANTIGEN: Hepatitis B Surface Ag: NEGATIVE

## 2020-04-06 LAB — HM HIV SCREENING LAB: HM HIV Screening: NEGATIVE

## 2020-04-06 LAB — HM HEPATITIS C SCREENING LAB: HM Hepatitis Screen: NEGATIVE

## 2020-04-26 NOTE — Telephone Encounter (Signed)
TC to patient. Verified ID via password/SS#. Informed of positive GC. Co TOC in 3 months. Richmond Campbell, RN

## 2021-04-15 ENCOUNTER — Emergency Department: Payer: Self-pay

## 2021-04-15 ENCOUNTER — Emergency Department
Admission: EM | Admit: 2021-04-15 | Discharge: 2021-04-15 | Disposition: A | Discharge status: Home / Self Care | Payer: Self-pay | Attending: Emergency Medicine | Admitting: Emergency Medicine

## 2021-04-15 ENCOUNTER — Other Ambulatory Visit: Payer: Self-pay

## 2021-04-15 DIAGNOSIS — M25511 Pain in right shoulder: Secondary | ICD-10-CM | POA: Insufficient documentation

## 2021-04-15 DIAGNOSIS — R079 Chest pain, unspecified: Secondary | ICD-10-CM | POA: Insufficient documentation

## 2021-04-15 DIAGNOSIS — S21219A Laceration without foreign body of unspecified back wall of thorax without penetration into thoracic cavity, initial encounter: Secondary | ICD-10-CM

## 2021-04-15 DIAGNOSIS — F17210 Nicotine dependence, cigarettes, uncomplicated: Secondary | ICD-10-CM | POA: Insufficient documentation

## 2021-04-15 DIAGNOSIS — R519 Headache, unspecified: Secondary | ICD-10-CM | POA: Insufficient documentation

## 2021-04-15 DIAGNOSIS — M542 Cervicalgia: Secondary | ICD-10-CM | POA: Insufficient documentation

## 2021-04-15 DIAGNOSIS — S21259A Open bite of unspecified back wall of thorax without penetration into thoracic cavity, initial encounter: Secondary | ICD-10-CM | POA: Insufficient documentation

## 2021-04-15 DIAGNOSIS — W503XXA Accidental bite by another person, initial encounter: Secondary | ICD-10-CM

## 2021-04-15 DIAGNOSIS — G8911 Acute pain due to trauma: Secondary | ICD-10-CM | POA: Insufficient documentation

## 2021-04-15 LAB — BASIC METABOLIC PANEL
Anion gap: 9 (ref 5–15)
BUN: 9 mg/dL (ref 6–20)
CO2: 27 mmol/L (ref 22–32)
Calcium: 9.2 mg/dL (ref 8.9–10.3)
Chloride: 104 mmol/L (ref 98–111)
Creatinine, Ser: 1.17 mg/dL (ref 0.61–1.24)
GFR, Estimated: >60 mL/min (ref >60)
Glucose, Bld: 92 mg/dL (ref 70–99)
Potassium: 4.4 mmol/L (ref 3.5–5.1)
Sodium: 140 mmol/L (ref 135–145)

## 2021-04-15 LAB — CBC WITH DIFFERENTIAL/PLATELET
Abs Immature Granulocytes: 0.04 10*3/uL (ref 0.00–0.07)
Basophils Absolute: 0 10*3/uL (ref 0.0–0.1)
Basophils Relative: 0 %
Eosinophils Absolute: 0 10*3/uL (ref 0.0–0.5)
Eosinophils Relative: 0 %
HCT: 43.4 % (ref 39.0–52.0)
Hemoglobin: 14.7 g/dL (ref 13.0–17.0)
Immature Granulocytes: 0 %
Lymphocytes Relative: 16 %
Lymphs Abs: 2 10*3/uL (ref 0.7–4.0)
MCH: 29.5 pg (ref 26.0–34.0)
MCHC: 33.9 g/dL (ref 30.0–36.0)
MCV: 87.1 fL (ref 80.0–100.0)
Monocytes Absolute: 1 10*3/uL (ref 0.1–1.0)
Monocytes Relative: 8 %
Neutro Abs: 9 10*3/uL — ABNORMAL HIGH (ref 1.7–7.7)
Neutrophils Relative %: 76 %
Platelets: 235 10*3/uL (ref 150–400)
RBC: 4.98 MIL/uL (ref 4.22–5.81)
RDW: 13.3 % (ref 11.5–15.5)
WBC: 12 10*3/uL — ABNORMAL HIGH (ref 4.0–10.5)
nRBC: 0 % (ref 0.0–0.2)

## 2021-04-15 MED ORDER — IOHEXOL 300 MG/ML  SOLN
100.0000 mL | Freq: Once | INTRAMUSCULAR | Status: AC | PRN
  Administered 2021-04-15: 100 mL via INTRAVENOUS
  Filled 2021-04-15: qty 100

## 2021-04-15 MED ORDER — MORPHINE SULFATE (PF) 4 MG/ML IV SOLN
4.0000 mg | Freq: Once | INTRAVENOUS | Status: AC
  Administered 2021-04-15: 4 mg via INTRAVENOUS
  Filled 2021-04-15: qty 1

## 2021-04-15 MED ORDER — AMOXICILLIN-POT CLAVULANATE 875-125 MG PO TABS: 1.0000 | ORAL_TABLET | Freq: Two times a day (BID) | ORAL | 0 refills | Status: AC

## 2021-04-15 MED ORDER — AMOXICILLIN-POT CLAVULANATE 875-125 MG PO TABS
1.0000 | ORAL_TABLET | Freq: Once | ORAL | Status: AC
  Administered 2021-04-15: 1 via ORAL
  Filled 2021-04-15: qty 1

## 2021-04-15 MED ORDER — OXYCODONE-ACETAMINOPHEN 5-325 MG PO TABS: 1.0000 | ORAL_TABLET | ORAL | 0 refills | Status: DC | PRN

## 2021-04-15 MED ORDER — OXYCODONE-ACETAMINOPHEN 5-325 MG PO TABS
1.0000 | ORAL_TABLET | Freq: Once | ORAL | Status: AC
  Administered 2021-04-15: 1 via ORAL
  Filled 2021-04-15: qty 1
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
## 2021-04-27 ENCOUNTER — Other Ambulatory Visit: Payer: Self-pay

## 2021-04-27 ENCOUNTER — Encounter: Payer: Self-pay | Admitting: Emergency Medicine

## 2021-04-27 ENCOUNTER — Ambulatory Visit
Admission: EM | Admit: 2021-04-27 | Discharge: 2021-04-27 | Disposition: A | Discharge status: Home / Self Care | Payer: Self-pay | Attending: Emergency Medicine | Admitting: Emergency Medicine

## 2021-04-27 DIAGNOSIS — Z20822 Contact with and (suspected) exposure to covid-19: Secondary | ICD-10-CM

## 2021-04-27 DIAGNOSIS — B349 Viral infection, unspecified: Secondary | ICD-10-CM

## 2021-04-27 DIAGNOSIS — J101 Influenza due to other identified influenza virus with other respiratory manifestations: Secondary | ICD-10-CM

## 2021-04-27 LAB — RAPID INFLUENZA A&B ANTIGENS
Influenza A (ARMC): NEGATIVE
Influenza B (ARMC): NEGATIVE

## 2021-04-27 LAB — RESP PANEL BY RT-PCR (FLU A&B, COVID) ARPGX2
Influenza A by PCR: POSITIVE — AB
Influenza B by PCR: NEGATIVE
SARS Coronavirus 2 by RT PCR: NEGATIVE

## 2021-04-27 MED ORDER — OSELTAMIVIR PHOSPHATE 75 MG PO CAPS: 75.0000 mg | ORAL_CAPSULE | Freq: Two times a day (BID) | ORAL | 0 refills | Status: DC

## 2021-04-27 MED ORDER — ACETAMINOPHEN 325 MG PO TABS
650.0000 mg | ORAL_TABLET | Freq: Once | ORAL | Status: AC
  Administered 2021-04-27: 17:00:00 650 mg via ORAL

## 2021-04-27 MED ORDER — IBUPROFEN 600 MG PO TABS: 600.0000 mg | ORAL_TABLET | Freq: Four times a day (QID) | ORAL | 0 refills | Status: DC | PRN

## 2021-04-27 MED ORDER — FLUTICASONE PROPIONATE 50 MCG/ACT NA SUSP: 2.0000 | Freq: Every day | NASAL | 0 refills | Status: DC

## 2021-04-27 NOTE — Discharge Instructions (Addendum)
Take 600 mg of ibuprofen, with 1000 mg of Tylenol together 3-4 times a day as needed for body aches, headaches, fevers.  Push plenty of electrolyte containing fluids such as Pedialyte or Gatorade.  Flonase, saline nasal irrigation with a NeilMed sinus rinse and distilled water as often as you want for nasal congestion and runny nose.  Mucinex D can also be very helpful.  I will contact you if and only if your COVID or flu come back positive and I will prescribe the appropriate antiviral.  Here is a list of primary care providers who are taking new patients:  Dr. Elizabeth Sauer [address] [place] [address] [phone]  [facility] Primary Care at [facility] [address] [place], [place] [address] [phone]  [facility] [place] [address]  [place] [address]  [phone]  [facility] [address] [place], [place] [address] [phone]  [facility] [address] [place]  [phone] [place], [place] [address]  Here are clinics/ other resources who will see you if you do not have insurance. Some have certain criteria that you must meet. Call them and find out what they are:  [facility]: [address], [place], [place] [address] Phone: [phone] Hours: First and Third Saturdays of each Month, 9 a.m. - 1 p.m.  [facility]: [address] [place], [place] [address] Phone: [phone] Hours: Tuesday, 4 p.m. - 8 p.m. Thursday, 1 p.m. - 8 p.m. Wednesday, 9 a.m. - [facility] [address] [place], [place] [address] Phone: [phone] Pharmacy Phone Number: [phone] Dental Phone Number: [phone] [facility] Insurance Help: [phone]  Dental Hours: Monday - Thursday, 8 a.m. - 6 p.m.  [facility] [address] [place], [place] [address] Phone: [phone] Pharmacy Phone Number: [phone] [facility] Insurance Help: [phone]  [facility] [address] [place] [place], [place] [address] Phone: [phone] Pharmacy Phone Number: [phone] [facility] Insurance Help: [phone]  [facility] [address] [place], [place] [address] Phone: [phone] [facility] Insurance Help: [phone]   [facility] [address] [place], [place] [address] Phone: [phone]  Go to www.goodrx.com  or www.costplusdrugs.com to look up your medications. This will give you a list of where you can find your prescriptions at the most affordable prices. Or ask the pharmacist what the cash price is, or if they have any other discount programs available to help make your medication more affordable. This can be less expensive than what you would pay with insurance.

## 2021-04-27 NOTE — ED Provider Notes (Signed)
HPI  SUBJECTIVE:  Dennis Sanders is a 30 y.o. male who presents with fevers of 100.5, body aches, headaches for nasal congestion, clear rhinorrhea, mild sore throat, cough productive of the same material as his nasal congestion starting this morning.  No postnasal drip, loss of smell or taste, wheezing, shortness of breath, nausea, vomiting, diarrhea, abdominal pain.  No known COVID or flu exposure.  He did not get the COVID or flu vaccines.  He took ibuprofen within 6 hours of evaluation.  He has been taking ibuprofen 400 mg and Mucinex with improvement in his symptoms.  No aggravating factors.  He states this feels similar to previous episode of COVID.  He had COVID in May 2021.  No other medical problems.  PMD: None.   Past Medical History:  Diagnosis Date   Marijuana abuse    a. smokes a few x/wk.   Syncope    a. 09/2017 Echo: EF 55-60%, no rwma; b. 10/2017 Event monitor: NSR, no significant arrhythmias. Rare period of bradycardia w/ rates into low 40's.   Tobacco abuse     History reviewed. No pertinent surgical history.  Family History  Problem Relation Age of Onset   Other Mother        alive and well @ 64   Other Father        alive and well @ 22   Arrhythmia Neg Hx     Social History   Tobacco Use   Smoking status: Some Days    Types: Cigarettes   Smokeless tobacco: Never   Tobacco comments:    Reports cigarette use x7 years.  Vaping Use   Vaping Use: Never used  Substance Use Topics   Alcohol use: Not Currently    Comment: occasional - special occasions only   Drug use: Yes    Frequency: 2.0 times per week    Types: Marijuana    No current facility-administered medications for this encounter.  Current Outpatient Medications:    fluticasone (FLONASE) 50 MCG/ACT nasal spray, Place 2 sprays into both nostrils daily., Disp: 16 g, Rfl: 0   ibuprofen (ADVIL) 600 MG tablet, Take 1 tablet (600 mg total) by mouth every 6 (six) hours as needed., Disp: 30 tablet, Rfl:  0   oseltamivir (TAMIFLU) 75 MG capsule, Take 1 capsule (75 mg total) by mouth 2 (two) times daily. X 5 days, Disp: 10 capsule, Rfl: 0  No Known Allergies   ROS  As noted in HPI.   Physical Exam  BP 125/76 (BP Location: Right Arm)    Pulse 78    Temp 99.6 F (37.6 C) (Oral)    Resp 18    SpO2 98%   Constitutional: Well developed, well nourished, no acute distress Eyes:  EOMI, conjunctiva normal bilaterally HENT: Normocephalic, atraumatic,mucus membranes moist clear clear nasal congestion.  Swollen, erythematous turbinates.  Positive frontal sinus tenderness.  Normal oropharynx, Neck: Positive cervical lymphadenopathy Respiratory: Normal inspiratory effort, lungs clear bilaterally Cardiovascular: Normal rate, regular rhythm, no murmurs, rubs, gallops GI: nondistended skin: No rash, skin intact Musculoskeletal: no deformities Neurologic: Alert & oriented x 3, no focal neuro deficits Psychiatric: Speech and behavior appropriate   ED Course   Medications  acetaminophen (TYLENOL) tablet 650 mg (650 mg Oral Given 04/27/21 1700)    Orders Placed This Encounter  Procedures   Rapid Influenza A&B Antigens    Standing Status:   Standing    Number of Occurrences:   1   Resp Panel by RT-PCR (  Flu A&B, Covid) Nasopharyngeal Swab    Standing Status:   Standing    Number of Occurrences:   1   Airborne and Contact precautions    Standing Status:   Standing    Number of Occurrences:   1    Results for orders placed or performed during the hospital encounter of 04/27/21 (from the past 24 hour(s))  Rapid Influenza A&B Antigens     Status: None   Collection Time: 04/27/21  5:01 PM   Specimen: Respiratory  Result Value Ref Range   Influenza A ([facility]) NEGATIVE NEGATIVE   Influenza B ([facility]) NEGATIVE NEGATIVE  Resp Panel by RT-PCR (Flu A&B, Covid) Nasopharyngeal Swab     Status: Abnormal   Collection Time: 04/27/21  6:13 PM   Specimen: Nasopharyngeal Swab; Nasopharyngeal(NP) swabs in  vial transport medium  Result Value Ref Range   SARS Coronavirus 2 by RT PCR NEGATIVE NEGATIVE   Influenza A by PCR POSITIVE (A) NEGATIVE   Influenza B by PCR NEGATIVE NEGATIVE   No results found.  ED Clinical Impression  1. Influenza A   2. Viral illness   3. Encounter for laboratory testing for COVID-19 virus      ED Assessment/Plan  Rapid flu negative, however, will check a COVID and flu PCR.  He qualifies for antivirals based on unvaccinated status.  Home with supportive treatment, Tylenol/ibuprofen, Flonase, Mucinex D, saline nasal irrigation.  Will provide primary care list and order assistance in finding a PMD.  work note.  [phone]  Influenza A positive.  Prescribing Tamiflu.  Attempted to reach patient twice.  Left voicemail x2.  Unable to send patient message.  He does not have MyChart.  will have staff try and to reach patient tomorrow.  Discussed labs, MDM, treatment plan, and plan for follow-up with patient. patient agrees with plan.   Meds ordered this encounter  Medications   acetaminophen (TYLENOL) tablet 650 mg   fluticasone (FLONASE) 50 MCG/ACT nasal spray    Sig: Place 2 sprays into both nostrils daily.    Dispense:  16 g    Refill:  0   ibuprofen (ADVIL) 600 MG tablet    Sig: Take 1 tablet (600 mg total) by mouth every 6 (six) hours as needed.    Dispense:  30 tablet    Refill:  0   oseltamivir (TAMIFLU) 75 MG capsule    Sig: Take 1 capsule (75 mg total) by mouth 2 (two) times daily. X 5 days    Dispense:  10 capsule    Refill:  0      *This clinic note was created using Scientist, clinical (histocompatibility and immunogenetics). Therefore, there may be occasional mistakes despite careful proofreading.  ?    Domenick Gong, MD 04/27/21 2019

## 2021-04-27 NOTE — ED Triage Notes (Signed)
Pt presents today with c/o of cough, fever and bodyaches that began last night. He took Tylenol at 10:30 am. Pain 9/10.

## 2021-08-04 ENCOUNTER — Other Ambulatory Visit: Payer: Self-pay

## 2021-08-04 ENCOUNTER — Emergency Department (HOSPITAL_COMMUNITY): Payer: BC Managed Care – PPO

## 2021-08-04 ENCOUNTER — Other Ambulatory Visit (HOSPITAL_COMMUNITY): Payer: Self-pay

## 2021-08-04 ENCOUNTER — Observation Stay (HOSPITAL_COMMUNITY): Payer: BC Managed Care – PPO

## 2021-08-04 ENCOUNTER — Encounter (HOSPITAL_COMMUNITY): Payer: Self-pay | Admitting: Surgery

## 2021-08-04 ENCOUNTER — Observation Stay (HOSPITAL_COMMUNITY)
Admission: EM | Admit: 2021-08-04 | Discharge: 2021-08-04 | Disposition: A | Discharge status: Home / Self Care | Payer: BC Managed Care – PPO | Attending: Surgery | Admitting: Surgery

## 2021-08-04 DIAGNOSIS — T148XXA Other injury of unspecified body region, initial encounter: Secondary | ICD-10-CM

## 2021-08-04 DIAGNOSIS — J939 Pneumothorax, unspecified: Secondary | ICD-10-CM | POA: Diagnosis present

## 2021-08-04 DIAGNOSIS — S270XXA Traumatic pneumothorax, initial encounter: Secondary | ICD-10-CM

## 2021-08-04 DIAGNOSIS — G8911 Acute pain due to trauma: Secondary | ICD-10-CM | POA: Diagnosis present

## 2021-08-04 DIAGNOSIS — S21112A Laceration without foreign body of left front wall of thorax without penetration into thoracic cavity, initial encounter: Secondary | ICD-10-CM | POA: Diagnosis present

## 2021-08-04 DIAGNOSIS — R10817 Generalized abdominal tenderness: Secondary | ICD-10-CM | POA: Diagnosis not present

## 2021-08-04 DIAGNOSIS — Z20822 Contact with and (suspected) exposure to covid-19: Secondary | ICD-10-CM | POA: Diagnosis not present

## 2021-08-04 DIAGNOSIS — S21242A Puncture wound with foreign body of left back wall of thorax without penetration into thoracic cavity, initial encounter: Principal | ICD-10-CM | POA: Insufficient documentation

## 2021-08-04 DIAGNOSIS — W260XXA Contact with knife, initial encounter: Secondary | ICD-10-CM | POA: Diagnosis not present

## 2021-08-04 HISTORY — DX: Other injury of unspecified body region, initial encounter: T14.8XXA

## 2021-08-04 LAB — RESP PANEL BY RT-PCR (FLU A&B, COVID) ARPGX2
Influenza A by PCR: NEGATIVE
Influenza B by PCR: NEGATIVE
SARS Coronavirus 2 by RT PCR: NEGATIVE

## 2021-08-04 LAB — I-STAT CHEM 8, ED
BUN: 5 mg/dL — ABNORMAL LOW (ref 6–20)
Calcium, Ion: 1.08 mmol/L — ABNORMAL LOW (ref 1.15–1.40)
Chloride: 105 mmol/L (ref 98–111)
Creatinine, Ser: 1.1 mg/dL (ref 0.61–1.24)
Glucose, Bld: 121 mg/dL — ABNORMAL HIGH (ref 70–99)
HCT: 41 % (ref 39.0–52.0)
Hemoglobin: 13.9 g/dL (ref 13.0–17.0)
Potassium: 2.9 mmol/L — ABNORMAL LOW (ref 3.5–5.1)
Sodium: 143 mmol/L (ref 135–145)
TCO2: 22 mmol/L (ref 22–32)

## 2021-08-04 LAB — CBC
HCT: 42.7 % (ref 39.0–52.0)
Hemoglobin: 14.2 g/dL (ref 13.0–17.0)
MCH: 29.8 pg (ref 26.0–34.0)
MCHC: 33.3 g/dL (ref 30.0–36.0)
MCV: 89.5 fL (ref 80.0–100.0)
Platelets: 262 10*3/uL (ref 150–400)
RBC: 4.77 MIL/uL (ref 4.22–5.81)
RDW: 12.8 % (ref 11.5–15.5)
WBC: 12.7 10*3/uL — ABNORMAL HIGH (ref 4.0–10.5)
nRBC: 0 % (ref 0.0–0.2)

## 2021-08-04 LAB — COMPREHENSIVE METABOLIC PANEL
ALT: 16 U/L (ref 0–44)
AST: 25 U/L (ref 15–41)
Albumin: 3.6 g/dL (ref 3.5–5.0)
Alkaline Phosphatase: 65 U/L (ref 38–126)
Anion gap: 13 (ref 5–15)
BUN: 6 mg/dL (ref 6–20)
CO2: 20 mmol/L — ABNORMAL LOW (ref 22–32)
Calcium: 8.5 mg/dL — ABNORMAL LOW (ref 8.9–10.3)
Chloride: 111 mmol/L (ref 98–111)
Creatinine, Ser: 1.19 mg/dL (ref 0.61–1.24)
GFR, Estimated: >60 mL/min (ref >60)
Glucose, Bld: 124 mg/dL — ABNORMAL HIGH (ref 70–99)
Potassium: 3 mmol/L — ABNORMAL LOW (ref 3.5–5.1)
Sodium: 144 mmol/L (ref 135–145)
Total Bilirubin: 0.7 mg/dL (ref 0.3–1.2)
Total Protein: 5.9 g/dL — ABNORMAL LOW (ref 6.5–8.1)

## 2021-08-04 LAB — BASIC METABOLIC PANEL
Anion gap: 11 (ref 5–15)
BUN: 6 mg/dL (ref 6–20)
CO2: 22 mmol/L (ref 22–32)
Calcium: 8.6 mg/dL — ABNORMAL LOW (ref 8.9–10.3)
Chloride: 106 mmol/L (ref 98–111)
Creatinine, Ser: 1 mg/dL (ref 0.61–1.24)
GFR, Estimated: >60 mL/min (ref >60)
Glucose, Bld: 101 mg/dL — ABNORMAL HIGH (ref 70–99)
Potassium: 4.1 mmol/L (ref 3.5–5.1)
Sodium: 139 mmol/L (ref 135–145)

## 2021-08-04 LAB — HIV ANTIBODY (ROUTINE TESTING W REFLEX): HIV Screen 4th Generation wRfx: NONREACTIVE

## 2021-08-04 LAB — URINALYSIS, ROUTINE W REFLEX MICROSCOPIC
Bilirubin Urine: NEGATIVE
Glucose, UA: NEGATIVE mg/dL
Hgb urine dipstick: NEGATIVE
Ketones, ur: NEGATIVE mg/dL
Leukocytes,Ua: NEGATIVE
Nitrite: NEGATIVE
Protein, ur: NEGATIVE mg/dL
Specific Gravity, Urine: 1.041 — ABNORMAL HIGH (ref 1.005–1.030)
pH: 5 (ref 5.0–8.0)

## 2021-08-04 LAB — ETHANOL: Alcohol, Ethyl (B): 77 mg/dL — ABNORMAL HIGH (ref <10)

## 2021-08-04 LAB — SAMPLE TO BLOOD BANK

## 2021-08-04 LAB — LACTIC ACID, PLASMA: Lactic Acid, Venous: 2.6 mmol/L (ref 0.5–1.9)

## 2021-08-04 MED ORDER — ACETAMINOPHEN 500 MG PO TABS
1000.0000 mg | ORAL_TABLET | Freq: Three times a day (TID) | ORAL | Status: DC
  Administered 2021-08-04: 1000 mg via ORAL
  Filled 2021-08-04: qty 2

## 2021-08-04 MED ORDER — OXYCODONE HCL 5 MG PO TABS
5.0000 mg | ORAL_TABLET | Freq: Four times a day (QID) | ORAL | 0 refills | Status: DC | PRN
Start: 2021-08-04 — End: 2024-01-27
  Filled 2021-08-04: qty 15, 4d supply, fill #0

## 2021-08-04 MED ORDER — ACETAMINOPHEN 500 MG PO TABS: 1000.0000 mg | ORAL_TABLET | Freq: Three times a day (TID) | ORAL | 0 refills | Status: DC | PRN

## 2021-08-04 MED ORDER — METHOCARBAMOL 500 MG PO TABS
750.0000 mg | ORAL_TABLET | Freq: Four times a day (QID) | ORAL | Status: DC
Start: 2021-08-04 — End: 2021-08-04
  Administered 2021-08-04: 750 mg via ORAL
  Filled 2021-08-04: qty 2

## 2021-08-04 MED ORDER — ONDANSETRON HCL 4 MG/2ML IJ SOLN: 4.0000 mg | Freq: Four times a day (QID) | INTRAMUSCULAR | Status: DC | PRN

## 2021-08-04 MED ORDER — SODIUM CHLORIDE 0.9 % IV SOLN
INTRAVENOUS | Status: DC | PRN
  Administered 2021-08-04: 1000 mL via INTRAVENOUS

## 2021-08-04 MED ORDER — HYDROMORPHONE HCL 1 MG/ML IJ SOLN
0.5000 mg | INTRAMUSCULAR | Status: DC | PRN
  Administered 2021-08-04 (×2): 0.5 mg via INTRAVENOUS
  Filled 2021-08-04 (×2): qty 1

## 2021-08-04 MED ORDER — DOCUSATE SODIUM 100 MG PO CAPS
100.0000 mg | ORAL_CAPSULE | Freq: Two times a day (BID) | ORAL | Status: DC
  Administered 2021-08-04: 100 mg via ORAL
  Filled 2021-08-04: qty 1

## 2021-08-04 MED ORDER — OXYCODONE HCL 5 MG PO TABS
5.0000 mg | ORAL_TABLET | ORAL | Status: DC | PRN
  Administered 2021-08-04 (×2): 5 mg via ORAL
  Filled 2021-08-04 (×2): qty 1

## 2021-08-04 MED ORDER — FENTANYL CITRATE PF 50 MCG/ML IJ SOSY
100.0000 ug | PREFILLED_SYRINGE | Freq: Once | INTRAMUSCULAR | Status: AC
  Administered 2021-08-04: 100 ug via INTRAVENOUS

## 2021-08-04 MED ORDER — ACETAMINOPHEN 500 MG PO TABS: 1000.0000 mg | ORAL_TABLET | Freq: Four times a day (QID) | ORAL | Status: DC

## 2021-08-04 MED ORDER — IBUPROFEN 200 MG PO TABS: 800.0000 mg | ORAL_TABLET | Freq: Three times a day (TID) | ORAL | 0 refills | Status: AC | PRN

## 2021-08-04 MED ORDER — TETANUS-DIPHTH-ACELL PERTUSSIS 5-2.5-18.5 LF-MCG/0.5 IM SUSY
0.5000 mL | PREFILLED_SYRINGE | Freq: Once | INTRAMUSCULAR | Status: DC
  Filled 2021-08-04: qty 0.5

## 2021-08-04 MED ORDER — METHOCARBAMOL 750 MG PO TABS
750.0000 mg | ORAL_TABLET | Freq: Three times a day (TID) | ORAL | 0 refills | Status: DC | PRN
  Filled 2021-08-04: qty 30, 10d supply, fill #0

## 2021-08-04 MED ORDER — FENTANYL CITRATE (PF) 100 MCG/2ML IJ SOLN
INTRAMUSCULAR | Status: AC
Start: 2021-08-04 — End: 2021-08-04
  Filled 2021-08-04: qty 2

## 2021-08-04 MED ORDER — ENOXAPARIN SODIUM 30 MG/0.3ML IJ SOSY: 30.0000 mg | PREFILLED_SYRINGE | Freq: Two times a day (BID) | INTRAMUSCULAR | Status: DC

## 2021-08-04 MED ORDER — IBUPROFEN 400 MG PO TABS: 600.0000 mg | ORAL_TABLET | Freq: Four times a day (QID) | ORAL | Status: DC

## 2021-08-04 MED ORDER — ONDANSETRON 4 MG PO TBDP: 4.0000 mg | ORAL_TABLET | Freq: Four times a day (QID) | ORAL | Status: DC | PRN

## 2021-08-04 MED ORDER — IOHEXOL 350 MG/ML SOLN
100.0000 mL | Freq: Once | INTRAVENOUS | Status: AC | PRN
  Administered 2021-08-04: 100 mL via INTRAVENOUS

## 2021-08-04 MED ORDER — OXYCODONE HCL 5 MG PO TABS: 5.0000 mg | ORAL_TABLET | ORAL | Status: DC | PRN

## 2021-08-04 NOTE — ED Notes (Signed)
Pt has returned from CT.  

## 2021-08-04 NOTE — Progress Notes (Signed)
Orthopedic Tech Progress Note ?Patient Details:  ?Dennis Sanders ?05/07/1873 ?[id number] ? ?Patient ID: Dennis Sanders, male   DOB: 05/07/1873, 31 y.o.   MRN: [id number] ?Level 1; not needed. ? ?[place] ?08/04/2021, 12:51 AM ? ?
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
## 2021-08-04 NOTE — TOC CAGE-AID Note (Signed)
Transition of Care (TOC) - CAGE-AID Screening ? ? ?Patient Details  ?Name: Dennis Sanders ?MRN: [id number] ?Date of Birth: July 02, 1990 ? ?Transition of Care (TOC) CM/SW Contact:    ?Endrit Gittins C Tarpley-Carter, LCSWA ?Phone Number: ?08/04/2021, 1:31 PM ? ? ?Clinical Narrative: ?Pt participated in Cage-Aid.  Pt stated he does not use substance (smokes cigarettes) or ETOH.  Pt was offered resources, due to usage of substance or ETOH.    ? ?Insurance underwriter, MSW, LCSW-A ?Pronouns:  She/Her/Hers ?[facility] ?Clinical Social Worker ?Direct Number:  [phone] ?[email or web address] ? ?CAGE-AID Screening: ?  ? ?Have You Ever Felt You Ought to Cut Down on Your Drinking or Drug Use?: No ?Have People Annoyed You By Critizing Your Drinking Or Drug Use?: No ?Have You Felt Bad Or Guilty About Your Drinking Or Drug Use?: No ?Have You Ever Had a Drink or Used Drugs First Thing In The Morning to Steady Your Nerves or to Get Rid of a Hangover?: No ?CAGE-AID Score: 0 ? ?Substance Abuse Education Offered: Yes ? ?Substance abuse interventions: Educational Materials ? ? ? ? ? ? ?

## 2021-08-04 NOTE — ED Notes (Signed)
Pt reports he received his tetanus shot 2 years ago and is refusing this injection. Dr. Madilyn Hook at bedside and aware. ?
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
## 2021-08-04 NOTE — ED Notes (Signed)
Patient verbalizes understanding of discharge instructions. Opportunity for questioning and answers were provided. Arm band removed by staff, patient discharged from ED. 
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
## 2021-08-04 NOTE — ED Notes (Signed)
Pt calling out in in severe pain - PRN meds to be given  ?
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
## 2021-08-07 ENCOUNTER — Encounter: Payer: Self-pay | Admitting: Emergency Medicine

## 2023-10-03 ENCOUNTER — Ambulatory Visit: Payer: Self-pay | Admitting: Podiatry

## 2023-10-07 ENCOUNTER — Ambulatory Visit: Payer: Self-pay | Admitting: Podiatry

## 2024-01-27 ENCOUNTER — Emergency Department
Admission: EM | Admit: 2024-01-27 | Discharge: 2024-01-27 | Disposition: A | Discharge status: Home / Self Care | Payer: Self-pay | Attending: Emergency Medicine | Admitting: Emergency Medicine

## 2024-01-27 ENCOUNTER — Other Ambulatory Visit: Payer: Self-pay

## 2024-01-27 ENCOUNTER — Encounter: Payer: Self-pay | Admitting: Emergency Medicine

## 2024-01-27 DIAGNOSIS — K029 Dental caries, unspecified: Secondary | ICD-10-CM | POA: Insufficient documentation

## 2024-01-27 DIAGNOSIS — F17200 Nicotine dependence, unspecified, uncomplicated: Secondary | ICD-10-CM | POA: Insufficient documentation

## 2024-01-27 MED ORDER — AMOXICILLIN-POT CLAVULANATE 875-125 MG PO TABS
1.0000 | ORAL_TABLET | Freq: Once | ORAL | Status: AC
  Administered 2024-01-27: 1 via ORAL
  Filled 2024-01-27: qty 1

## 2024-01-27 MED ORDER — LIDOCAINE VISCOUS HCL 2 % MT SOLN
15.0000 mL | Freq: Once | OROMUCOSAL | Status: AC
  Administered 2024-01-27: 15 mL via OROMUCOSAL
  Filled 2024-01-27: qty 15

## 2024-01-27 MED ORDER — IBUPROFEN 800 MG PO TABS
800.0000 mg | ORAL_TABLET | Freq: Once | ORAL | Status: AC
  Administered 2024-01-27: 800 mg via ORAL
  Filled 2024-01-27: qty 1

## 2024-01-27 MED ORDER — IBUPROFEN 800 MG PO TABS: 800.0000 mg | ORAL_TABLET | Freq: Three times a day (TID) | ORAL | 0 refills | Status: AC | PRN

## 2024-01-27 MED ORDER — HYDROCODONE-ACETAMINOPHEN 5-325 MG PO TABS
2.0000 | ORAL_TABLET | Freq: Once | ORAL | Status: AC
  Administered 2024-01-27: 2 via ORAL
  Filled 2024-01-27: qty 2

## 2024-01-27 MED ORDER — AMOXICILLIN-POT CLAVULANATE 875-125 MG PO TABS: 1.0000 | ORAL_TABLET | Freq: Two times a day (BID) | ORAL | 0 refills | Status: AC

## 2024-01-27 MED ORDER — ONDANSETRON 4 MG PO TBDP: 4.0000 mg | ORAL_TABLET | Freq: Four times a day (QID) | ORAL | 0 refills | Status: AC | PRN

## 2024-01-27 MED ORDER — HYDROCODONE-ACETAMINOPHEN 5-325 MG PO TABS: 1.0000 | ORAL_TABLET | Freq: Three times a day (TID) | ORAL | 0 refills | Status: AC | PRN
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
## 2024-01-27 NOTE — Discharge Instructions (Addendum)
 You may use over-the-counter Orajel which has the same numbing medicine in it that the numbing lollipop that you have used previously has.  This can help with your pain.  You are being provided a prescription for opiates (also known as narcotics) for pain control.  Opiates can be addictive and should only be used when absolutely necessary for pain control when other alternatives do not work.  We recommend you only use them for the recommended amount of time and only as prescribed.  Please do not take with other sedative medications or alcohol.  Please do not drive, operate machinery, make important decisions while taking opiates.  Please note that these medications can be addictive and have high abuse potential.  Patients can become addicted to narcotics after only taking them for a few days.  Please keep these medications locked away from children, teenagers or any family members with history of substance abuse.  Narcotic pain medicine may also make you constipated.  You may use over-the-counter medications such as MiraLAX, Colace to prevent constipation.  If you become constipated, you may use over-the-counter enemas as needed.  Itching and nausea are also common side effects of narcotic pain medication.  If you develop uncontrolled vomiting or a rash, please stop these medications and seek medical care.
